# Patient Record
Sex: Female | Born: 1970 | Race: Black or African American | Hispanic: No | Marital: Married | Smoking: Never smoker
Health system: Southern US, Community
[De-identification: ages and names within clinical notes are randomized; demographics above are authoritative.]

## PROBLEM LIST (undated history)

## (undated) DIAGNOSIS — J40 Bronchitis, not specified as acute or chronic: Secondary | ICD-10-CM

## (undated) DIAGNOSIS — I10 Essential (primary) hypertension: Secondary | ICD-10-CM

---

## 1997-12-17 ENCOUNTER — Emergency Department (HOSPITAL_COMMUNITY): Admission: EM | Admit: 1997-12-17 | Discharge: 1997-12-17 | Payer: Self-pay | Admitting: Emergency Medicine

## 1998-05-08 ENCOUNTER — Emergency Department (HOSPITAL_COMMUNITY): Admission: EM | Admit: 1998-05-08 | Discharge: 1998-05-08 | Payer: Self-pay | Admitting: Emergency Medicine

## 1998-06-02 ENCOUNTER — Emergency Department (HOSPITAL_COMMUNITY): Admission: EM | Admit: 1998-06-02 | Discharge: 1998-06-02 | Payer: Self-pay | Admitting: Emergency Medicine

## 1999-06-05 ENCOUNTER — Other Ambulatory Visit: Admission: RE | Admit: 1999-06-05 | Discharge: 1999-06-05 | Payer: Self-pay | Admitting: Obstetrics

## 2000-10-16 ENCOUNTER — Emergency Department (HOSPITAL_COMMUNITY): Admission: EM | Admit: 2000-10-16 | Discharge: 2000-10-16 | Payer: Self-pay | Admitting: Emergency Medicine

## 2000-10-16 ENCOUNTER — Encounter: Payer: Self-pay | Admitting: Emergency Medicine

## 2001-12-19 ENCOUNTER — Emergency Department (HOSPITAL_COMMUNITY): Admission: EM | Admit: 2001-12-19 | Discharge: 2001-12-19 | Payer: Self-pay | Admitting: Emergency Medicine

## 2003-07-12 ENCOUNTER — Emergency Department (HOSPITAL_COMMUNITY): Admission: AD | Admit: 2003-07-12 | Discharge: 2003-07-12 | Payer: Self-pay | Admitting: Family Medicine

## 2006-01-09 ENCOUNTER — Emergency Department (HOSPITAL_COMMUNITY): Admission: EM | Admit: 2006-01-09 | Discharge: 2006-01-09 | Payer: Self-pay | Admitting: Emergency Medicine

## 2010-01-23 ENCOUNTER — Emergency Department (HOSPITAL_COMMUNITY): Admission: EM | Admit: 2010-01-23 | Discharge: 2010-01-23 | Payer: Self-pay | Admitting: Emergency Medicine

## 2010-03-11 ENCOUNTER — Emergency Department (HOSPITAL_COMMUNITY): Admission: EM | Admit: 2010-03-11 | Discharge: 2010-03-11 | Payer: Self-pay | Admitting: Emergency Medicine

## 2010-10-30 ENCOUNTER — Emergency Department (HOSPITAL_COMMUNITY)
Admission: EM | Admit: 2010-10-30 | Discharge: 2010-10-30 | Disposition: A | Discharge status: Home / Self Care | Payer: Medicaid Other | Attending: Emergency Medicine | Admitting: Emergency Medicine

## 2010-10-30 DIAGNOSIS — I10 Essential (primary) hypertension: Secondary | ICD-10-CM | POA: Insufficient documentation

## 2010-10-30 DIAGNOSIS — H9209 Otalgia, unspecified ear: Secondary | ICD-10-CM | POA: Insufficient documentation

## 2010-10-30 DIAGNOSIS — K089 Disorder of teeth and supporting structures, unspecified: Secondary | ICD-10-CM | POA: Insufficient documentation

## 2010-10-30 DIAGNOSIS — K029 Dental caries, unspecified: Secondary | ICD-10-CM | POA: Insufficient documentation

## 2010-10-30 DIAGNOSIS — J45909 Unspecified asthma, uncomplicated: Secondary | ICD-10-CM | POA: Insufficient documentation

## 2010-10-30 DIAGNOSIS — R1013 Epigastric pain: Secondary | ICD-10-CM | POA: Insufficient documentation

## 2010-10-30 LAB — URINALYSIS, ROUTINE W REFLEX MICROSCOPIC
Bilirubin Urine: NEGATIVE
Glucose, UA: NEGATIVE mg/dL
Hgb urine dipstick: NEGATIVE
Ketones, ur: NEGATIVE mg/dL
Nitrite: NEGATIVE
Protein, ur: NEGATIVE mg/dL
Specific Gravity, Urine: 1.022 (ref 1.005–1.030)
Urobilinogen, UA: 0.2 mg/dL (ref 0.0–1.0)
pH: 5.5 (ref 5.0–8.0)

## 2010-10-30 LAB — POCT PREGNANCY, URINE: Preg Test, Ur: NEGATIVE

## 2011-01-20 ENCOUNTER — Emergency Department (HOSPITAL_COMMUNITY)
Admission: EM | Admit: 2011-01-20 | Discharge: 2011-01-21 | Disposition: A | Discharge status: Home / Self Care | Payer: Medicaid Other | Attending: Emergency Medicine | Admitting: Emergency Medicine

## 2011-01-20 DIAGNOSIS — K029 Dental caries, unspecified: Secondary | ICD-10-CM | POA: Insufficient documentation

## 2011-01-20 DIAGNOSIS — J45909 Unspecified asthma, uncomplicated: Secondary | ICD-10-CM | POA: Insufficient documentation

## 2011-01-20 DIAGNOSIS — K047 Periapical abscess without sinus: Secondary | ICD-10-CM | POA: Insufficient documentation

## 2011-01-20 DIAGNOSIS — I10 Essential (primary) hypertension: Secondary | ICD-10-CM | POA: Insufficient documentation

## 2011-01-20 DIAGNOSIS — K089 Disorder of teeth and supporting structures, unspecified: Secondary | ICD-10-CM | POA: Insufficient documentation

## 2011-01-31 ENCOUNTER — Emergency Department (HOSPITAL_COMMUNITY)
Admission: EM | Admit: 2011-01-31 | Discharge: 2011-01-31 | Disposition: A | Discharge status: Home / Self Care | Payer: Medicaid Other | Attending: Emergency Medicine | Admitting: Emergency Medicine

## 2011-01-31 DIAGNOSIS — I10 Essential (primary) hypertension: Secondary | ICD-10-CM | POA: Insufficient documentation

## 2011-01-31 DIAGNOSIS — J45909 Unspecified asthma, uncomplicated: Secondary | ICD-10-CM | POA: Insufficient documentation

## 2011-01-31 DIAGNOSIS — M79609 Pain in unspecified limb: Secondary | ICD-10-CM | POA: Insufficient documentation

## 2011-01-31 DIAGNOSIS — G5600 Carpal tunnel syndrome, unspecified upper limb: Secondary | ICD-10-CM | POA: Insufficient documentation

## 2011-01-31 DIAGNOSIS — Z79899 Other long term (current) drug therapy: Secondary | ICD-10-CM | POA: Insufficient documentation

## 2011-01-31 DIAGNOSIS — M25539 Pain in unspecified wrist: Secondary | ICD-10-CM | POA: Insufficient documentation

## 2011-05-13 ENCOUNTER — Emergency Department (HOSPITAL_COMMUNITY)
Admission: EM | Admit: 2011-05-13 | Discharge: 2011-05-13 | Disposition: A | Discharge status: Home / Self Care | Payer: Medicaid Other | Attending: Emergency Medicine | Admitting: Emergency Medicine

## 2011-05-13 ENCOUNTER — Encounter: Payer: Self-pay | Admitting: Emergency Medicine

## 2011-05-13 DIAGNOSIS — K029 Dental caries, unspecified: Secondary | ICD-10-CM | POA: Insufficient documentation

## 2011-05-13 DIAGNOSIS — K0889 Other specified disorders of teeth and supporting structures: Secondary | ICD-10-CM

## 2011-05-13 DIAGNOSIS — S025XXA Fracture of tooth (traumatic), initial encounter for closed fracture: Secondary | ICD-10-CM | POA: Insufficient documentation

## 2011-05-13 DIAGNOSIS — IMO0002 Reserved for concepts with insufficient information to code with codable children: Secondary | ICD-10-CM | POA: Insufficient documentation

## 2011-05-13 DIAGNOSIS — K089 Disorder of teeth and supporting structures, unspecified: Secondary | ICD-10-CM | POA: Insufficient documentation

## 2011-05-13 MED ORDER — PENICILLIN V POTASSIUM 500 MG PO TABS: 500.0000 mg | ORAL_TABLET | Freq: Four times a day (QID) | ORAL | Status: AC

## 2011-05-13 MED ORDER — TRAMADOL HCL 50 MG PO TABS: 50.0000 mg | ORAL_TABLET | Freq: Four times a day (QID) | ORAL | Status: AC | PRN

## 2011-05-13 NOTE — ED Notes (Signed)
Pt stated, i had 2 teeth to break off 2 days ago and having pain.

## 2011-05-13 NOTE — ED Notes (Signed)
C/o tooth pain X4d, also report ear pain and HA

## 2011-05-13 NOTE — ED Provider Notes (Signed)
History     CSN: [id number] Arrival date & time: 05/13/2011  7:31 AM   First MD Initiated Contact with Patient 05/13/11 0750     Chief Complaint  Patient presents with  . Dental Pain     Patient is a 40 y.o. female presenting with tooth pain. The history is provided by the patient.  Dental PainThe primary symptoms include mouth pain and dental injury. Primary symptoms do not include headaches, fever or sore throat. The symptoms began 3 to 5 days ago. The symptoms are unchanged. The symptoms are new. The symptoms occur constantly.  Additional symptoms include: dental sensitivity to temperature. Additional symptoms do not include: gum swelling, gum tenderness, purulent gums, trismus, jaw pain, facial swelling, trouble swallowing, pain with swallowing, excessive salivation, dry mouth, taste disturbance, smell disturbance and drooling. Medical issues include: periodontal disease. Medical issues do not include: alcohol problem, smoking and chewing tobacco.  She was eating a candy apple 4 days ago when 2 R upper teeth broke off. She does not have a dentist that she sees on a regular basis. She has not noted any facial swelling but states she has pain in her cheek which seems to radiate into her ear.  History reviewed. No pertinent past medical history.  History reviewed. No pertinent past surgical history.  No family history on file.  History  Substance Use Topics  . Smoking status: Never Smoker   . Smokeless tobacco: Not on file  . Alcohol Use: No    OB History    Grav Para Term Preterm Abortions TAB SAB Ect Mult Living                  Review of Systems  Constitutional: Negative.  Negative for fever.  HENT: Positive for dental problem. Negative for sore throat, facial swelling, drooling, mouth sores, trouble swallowing and voice change.   Respiratory: Negative.   Cardiovascular: Negative.   Neurological: Negative.  Negative for headaches.    Allergies  Tylenol  Home  Medications  No current outpatient prescriptions on file.  BP 110/79  Pulse 79  Temp(Src) 97.9 F (36.6 C) (Oral)  Resp 20  SpO2 100%  Physical Exam  Nursing note and vitals reviewed. Constitutional: She is oriented to person, place, and time. She appears well-developed and well-nourished. No distress.  HENT:  Head: Normocephalic and atraumatic. No trismus in the jaw.  Right Ear: External ear normal.  Left Ear: External ear normal.  Nose: Nose normal.  Mouth/Throat: Uvula is midline, oropharynx is clear and moist and mucous membranes are normal. Abnormal dentition. Dental caries present. No dental abscesses. No oropharyngeal exudate.    Eyes: Conjunctivae are normal. Pupils are equal, round, and reactive to light.  Neck: Normal range of motion. Neck supple. No tracheal deviation present. No thyromegaly present.  Pulmonary/Chest: No stridor.  Lymphadenopathy:    She has no cervical adenopathy.  Neurological: She is alert and oriented to person, place, and time. No cranial nerve deficit.  Skin: Skin is warm and dry. No rash noted. She is not diaphoretic. No erythema.  Psychiatric: She has a normal mood and affect. Her behavior is normal.    ED Course  Procedures (including critical care time)  Labs Reviewed - No data to display No results found.   1. Pain, dental       MDM  Patient has two broken teeth which are causing her pain. She was given rxes for pain medication and abx. She is aware that she is  to follow up with a dentist as soon as possible. She was given a list of free/low cost dental resources in the area. Patient verbalized understanding and agreed to plan.        Grant Fontana, [place] 05/13/11 [phone]

## 2011-05-13 NOTE — ED Provider Notes (Signed)
Medical screening examination/treatment/procedure(s) were performed by non-physician practitioner and as supervising physician I was immediately available for consultation/collaboration.  Jaedan Schuman P Md Smola, MD 05/13/11 0810 

## 2011-05-16 NOTE — ED Provider Notes (Signed)
Medical screening examination/treatment/procedure(s) were performed by non-physician practitioner and as supervising physician I was immediately available for consultation/collaboration.  Nicholes Stairs, MD 05/16/11 [phone]

## 2011-06-16 ENCOUNTER — Emergency Department (HOSPITAL_COMMUNITY)
Admission: EM | Admit: 2011-06-16 | Discharge: 2011-06-16 | Disposition: A | Discharge status: Home / Self Care | Payer: Medicaid Other | Attending: Emergency Medicine | Admitting: Emergency Medicine

## 2011-06-16 ENCOUNTER — Emergency Department (HOSPITAL_COMMUNITY): Payer: Medicaid Other

## 2011-06-16 ENCOUNTER — Encounter (HOSPITAL_COMMUNITY): Payer: Self-pay | Admitting: Emergency Medicine

## 2011-06-16 DIAGNOSIS — R51 Headache: Secondary | ICD-10-CM | POA: Insufficient documentation

## 2011-06-16 DIAGNOSIS — R197 Diarrhea, unspecified: Secondary | ICD-10-CM | POA: Insufficient documentation

## 2011-06-16 DIAGNOSIS — J111 Influenza due to unidentified influenza virus with other respiratory manifestations: Secondary | ICD-10-CM

## 2011-06-16 DIAGNOSIS — J45909 Unspecified asthma, uncomplicated: Secondary | ICD-10-CM | POA: Insufficient documentation

## 2011-06-16 DIAGNOSIS — R6889 Other general symptoms and signs: Secondary | ICD-10-CM | POA: Insufficient documentation

## 2011-06-16 DIAGNOSIS — M2550 Pain in unspecified joint: Secondary | ICD-10-CM | POA: Insufficient documentation

## 2011-06-16 DIAGNOSIS — R05 Cough: Secondary | ICD-10-CM | POA: Insufficient documentation

## 2011-06-16 DIAGNOSIS — R059 Cough, unspecified: Secondary | ICD-10-CM | POA: Insufficient documentation

## 2011-06-16 DIAGNOSIS — R509 Fever, unspecified: Secondary | ICD-10-CM | POA: Insufficient documentation

## 2011-06-16 DIAGNOSIS — I10 Essential (primary) hypertension: Secondary | ICD-10-CM | POA: Insufficient documentation

## 2011-06-16 DIAGNOSIS — IMO0001 Reserved for inherently not codable concepts without codable children: Secondary | ICD-10-CM | POA: Insufficient documentation

## 2011-06-16 DIAGNOSIS — R112 Nausea with vomiting, unspecified: Secondary | ICD-10-CM | POA: Insufficient documentation

## 2011-06-16 HISTORY — DX: Bronchitis, not specified as acute or chronic: J40

## 2011-06-16 HISTORY — DX: Essential (primary) hypertension: I10

## 2011-06-16 MED ORDER — PROMETHAZINE HCL 25 MG PO TABS: 25.0000 mg | ORAL_TABLET | Freq: Four times a day (QID) | ORAL | Status: AC | PRN

## 2011-06-16 MED ORDER — SODIUM CHLORIDE 0.9 % IV BOLUS (SEPSIS)
1000.0000 mL | Freq: Once | INTRAVENOUS | Status: AC
  Administered 2011-06-16: 1000 mL via INTRAVENOUS

## 2011-06-16 MED ORDER — HYDROCOD POLST-CHLORPHEN POLST 10-8 MG/5ML PO LQCR: 5.0000 mL | Freq: Two times a day (BID) | ORAL | Status: DC

## 2011-06-16 MED ORDER — ONDANSETRON HCL 4 MG/2ML IJ SOLN
4.0000 mg | Freq: Once | INTRAMUSCULAR | Status: AC
  Administered 2011-06-16: 4 mg via INTRAVENOUS
  Filled 2011-06-16: qty 2

## 2011-06-16 NOTE — ED Notes (Signed)
Pt report n/v/d onset yesterday. Pt report cold symptoms x 1 week.

## 2011-06-16 NOTE — ED Provider Notes (Signed)
History     CSN: [id number] Arrival date & time: 06/16/2011  6:32 PM   First MD Initiated Contact with Patient 06/16/11 2035      Chief Complaint  Patient presents with  . Nausea  . Emesis  . Diarrhea    (Consider location/radiation/quality/duration/timing/severity/associated sxs/prior treatment) HPI Comments: Patient here with a week of ILI like symptoms, states she has tried OTC medications without relief - states that her daughter recently diagnosed with PNA and she wants to make sure she does not have this - reports productive cough with yellow sputum production.  Patient is a 40 y.o. female presenting with vomiting and diarrhea. The history is provided by the patient. No language interpreter was used.  Emesis  This is a new problem. The current episode started more than 1 week ago. The problem occurs 2 to 4 times per day. The problem has not changed since onset.The emesis has an appearance of stomach contents. The maximum temperature recorded prior to her arrival was 100 to 100.9 F. The fever has been present for 1 to 2 days. Associated symptoms include arthralgias, chills, cough, diarrhea, a fever, headaches, myalgias and URI. Pertinent negatives include no abdominal pain and no sweats. Risk factors include ill contacts.  Diarrhea The primary symptoms include fever, vomiting, diarrhea, myalgias and arthralgias. Primary symptoms do not include abdominal pain. The illness began 3 to 5 days ago. The onset was gradual.  The fever began more than 1 week ago. The fever has been unchanged since its onset. The maximum temperature recorded prior to her arrival was unknown.  The illness is also significant for chills. The illness does not include bloating, constipation, tenesmus or back pain.    Past Medical History  Diagnosis Date  . Hypertension   . Asthma   . Bronchitis     History reviewed. No pertinent past surgical history.  History reviewed. No pertinent family  history.  History  Substance Use Topics  . Smoking status: Never Smoker   . Smokeless tobacco: Not on file  . Alcohol Use: No    OB History    Grav Para Term Preterm Abortions TAB SAB Ect Mult Living                  Review of Systems  Constitutional: Positive for fever and chills.  Respiratory: Positive for cough.   Gastrointestinal: Positive for vomiting and diarrhea. Negative for abdominal pain, constipation and bloating.  Musculoskeletal: Positive for myalgias and arthralgias. Negative for back pain.  Neurological: Positive for headaches.  All other systems reviewed and are negative.    Allergies  Tylenol  Home Medications   Current Outpatient Rx  Name Route Sig Dispense Refill  . IBUPROFEN PO Oral Take 1 tablet by mouth every 6 (six) hours as needed. For fever.       BP 121/89  Pulse 94  Temp(Src) 98.2 F (36.8 C) (Oral)  Resp 18  SpO2 100%  LMP 06/11/2011  Physical Exam  Nursing note and vitals reviewed. Constitutional: She is oriented to person, place, and time. She appears well-developed and well-nourished. No distress.  HENT:  Head: Normocephalic and atraumatic.  Right Ear: External ear normal.  Left Ear: External ear normal.  Nose: Nose normal.  Mouth/Throat: Oropharynx is clear and moist. No oropharyngeal exudate.  Eyes: Conjunctivae are normal. Pupils are equal, round, and reactive to light. No scleral icterus.  Neck: Normal range of motion. Neck supple.  Cardiovascular: Normal rate, regular rhythm and normal heart sounds.  Pulmonary/Chest: Effort normal and breath sounds normal. No respiratory distress. She has no wheezes. She exhibits no tenderness.  Abdominal: Soft. Bowel sounds are normal. There is no tenderness.  Musculoskeletal: Normal range of motion.  Lymphadenopathy:    She has no cervical adenopathy.  Neurological: She is alert and oriented to person, place, and time. No cranial nerve deficit.  Skin: Skin is warm and dry.   Psychiatric: She has a normal mood and affect. Her behavior is normal. Judgment and thought content normal.    ED Course  Procedures (including critical care time)  Labs Reviewed - No data to display Dg Chest 2 View  06/16/2011  *RADIOLOGY REPORT*  Clinical Data: Cough.  Chest congestion.  Nausea and vomiting. Smoker with history of hypertension.  CHEST - 2 VIEW 06/16/2011:  Comparison: One-view chest x-ray 07/12/2003 [facility].  Findings: Cardiomediastinal silhouette unremarkable.  Lungs clear. Bronchovascular markings normal.  Pulmonary vascularity normal.  No pleural effusions.  No pneumothorax.  Visualized bony thorax intact.  No significant interval change.  IMPRESSION: Normal examination.  Original Report Authenticated By: Arnell Sieving, M.D.     Influenza like illness    MDM  Patient with ILI x 1 week - moderately dry based on mucous membranes, will give a liter of fluids, get chest x-ray to make sure no bacterial component and then treat symtpoms.    Feels better after a liter of fluids, able to keep down po's, will discharge home    Scarlette Calico C. [place], [place] 06/16/11 2253

## 2011-06-17 NOTE — ED Provider Notes (Signed)
Medical screening examination/treatment/procedure(s) were performed by non-physician practitioner and as supervising physician I was immediately available for consultation/collaboration.   Lovie Zarling M Trevonn Hallum, MD 06/17/11 0026 

## 2011-10-30 ENCOUNTER — Ambulatory Visit (HOSPITAL_COMMUNITY)
Admission: RE | Admit: 2011-10-30 | Discharge: 2011-10-30 | Disposition: A | Discharge status: Home / Self Care | Payer: Medicaid Other | Source: Ambulatory Visit | Attending: Internal Medicine | Admitting: Internal Medicine

## 2011-10-30 ENCOUNTER — Other Ambulatory Visit (HOSPITAL_COMMUNITY): Payer: Self-pay | Admitting: Internal Medicine

## 2011-10-30 DIAGNOSIS — M545 Low back pain: Secondary | ICD-10-CM

## 2011-10-30 DIAGNOSIS — M62838 Other muscle spasm: Secondary | ICD-10-CM

## 2011-10-30 DIAGNOSIS — M542 Cervicalgia: Secondary | ICD-10-CM | POA: Insufficient documentation

## 2015-07-18 ENCOUNTER — Encounter (HOSPITAL_COMMUNITY): Payer: Self-pay | Admitting: Emergency Medicine

## 2015-07-18 ENCOUNTER — Emergency Department (HOSPITAL_COMMUNITY)
Admission: EM | Admit: 2015-07-18 | Discharge: 2015-07-19 | Disposition: A | Discharge status: Home / Self Care | Payer: Medicaid Other | Attending: Emergency Medicine | Admitting: Emergency Medicine

## 2015-07-18 ENCOUNTER — Emergency Department (HOSPITAL_COMMUNITY): Payer: Medicaid Other

## 2015-07-18 DIAGNOSIS — Z79899 Other long term (current) drug therapy: Secondary | ICD-10-CM | POA: Insufficient documentation

## 2015-07-18 DIAGNOSIS — I10 Essential (primary) hypertension: Secondary | ICD-10-CM | POA: Insufficient documentation

## 2015-07-18 DIAGNOSIS — J45901 Unspecified asthma with (acute) exacerbation: Secondary | ICD-10-CM

## 2015-07-18 DIAGNOSIS — J189 Pneumonia, unspecified organism: Secondary | ICD-10-CM

## 2015-07-18 DIAGNOSIS — R630 Anorexia: Secondary | ICD-10-CM | POA: Insufficient documentation

## 2015-07-18 DIAGNOSIS — J159 Unspecified bacterial pneumonia: Secondary | ICD-10-CM | POA: Insufficient documentation

## 2015-07-18 MED ORDER — IPRATROPIUM BROMIDE 0.02 % IN SOLN
1.0000 mg | Freq: Once | RESPIRATORY_TRACT | Status: AC
  Administered 2015-07-19: 1 mg via RESPIRATORY_TRACT
  Filled 2015-07-18: qty 5

## 2015-07-18 MED ORDER — HYDROCOD POLST-CPM POLST ER 10-8 MG/5ML PO SUER
5.0000 mL | Freq: Once | ORAL | Status: AC
  Administered 2015-07-18: 5 mL via ORAL
  Filled 2015-07-18: qty 5

## 2015-07-18 MED ORDER — PREDNISONE 20 MG PO TABS
60.0000 mg | ORAL_TABLET | Freq: Once | ORAL | Status: AC
  Administered 2015-07-18: 60 mg via ORAL
  Filled 2015-07-18: qty 3

## 2015-07-18 MED ORDER — ALBUTEROL (5 MG/ML) CONTINUOUS INHALATION SOLN
10.0000 mg/h | INHALATION_SOLUTION | RESPIRATORY_TRACT | Status: DC
  Administered 2015-07-19: 10 mg/h via RESPIRATORY_TRACT
  Filled 2015-07-18: qty 20

## 2015-07-18 MED ORDER — IBUPROFEN 800 MG PO TABS
800.0000 mg | ORAL_TABLET | Freq: Once | ORAL | Status: AC
  Administered 2015-07-18: 800 mg via ORAL
  Filled 2015-07-18: qty 1

## 2015-07-18 NOTE — ED Notes (Signed)
Pt transported to radiology.

## 2015-07-18 NOTE — ED Provider Notes (Signed)
CSN: [id number]     Arrival date & time 07/18/15  2252 History   By signing my name below, I, Gonzella LexKimberly Bianca Gray, attest that this documentation has been prepared under the direction and in the presence of Tomasita CrumbleAdeleke Mirabella Hilario, MD. Electronically Signed: Gonzella LexKimberly Bianca Gray, Scribe. 07/18/2015. 11:52 PM.    Chief Complaint  Patient presents with  . Asthma  . Generalized Body Aches    The history is provided by the patient. No language interpreter was used.    HPI Comments: Sheila Pena is a 45 y.o. female who presents to the Emergency Department complaining of worsening asthma and bronchitis symptoms, generalized body aches, SOB, difficulty sleeping, loss of appetite, rhinorrhea, congestion and productive cough with green sputum for the past week. She states that she has been around multiple sick contacts. Pt has been using her albuterol inhaler, cold medication, NyQuil, and spiriva with no relief of symptoms. She has not had a flu shot this year. Pt's temperature was measured to be 100 in the ED.   Past Medical History  Diagnosis Date  . Hypertension   . Asthma   . Bronchitis    History reviewed. No pertinent past surgical history. No family history on file. Social History  Substance Use Topics  . Smoking status: Never Smoker   . Smokeless tobacco: None  . Alcohol Use: No   OB History    No data available     Review of Systems  Constitutional: Positive for fever and appetite change.  HENT: Positive for congestion and rhinorrhea.   Respiratory: Positive for cough and shortness of breath.   All other systems reviewed and are negative.  Allergies  Tylenol  Home Medications   Prior to Admission medications   Medication Sig Start Date End Date Taking? Authorizing Provider  chlorpheniramine-HYDROcodone (TUSSIONEX PENNKINETIC ER) 10-8 MG/5ML LQCR Take 5 mLs by mouth every 12 (twelve) hours. 06/16/11   Cherrie DistanceFrances Sanford, PA-C  IBUPROFEN PO Take 1 tablet by mouth every 6 (six) hours as  needed. For fever.     Historical Provider, MD   BP 173/109 mmHg  Pulse 115  Temp(Src) 98.3 F (36.8 C) (Oral)  Resp 16  Ht 5\' 3"  (1.6 m)  Wt 155 lb (70.308 kg)  BMI 27.46 kg/m2  SpO2 96%  LMP 06/11/2011 Physical Exam  Constitutional: She is oriented to person, place, and time. She appears well-developed and well-nourished. No distress.  HENT:  Head: Normocephalic and atraumatic.  Nose: Nose normal.  Mouth/Throat: Oropharynx is clear and moist. No oropharyngeal exudate.  tactile fever   Eyes: Conjunctivae and EOM are normal. Pupils are equal, round, and reactive to light. No scleral icterus.  Neck: Normal range of motion. Neck supple. No JVD present. No tracheal deviation present. No thyromegaly present.  Cardiovascular: Normal rate, regular rhythm and normal heart sounds.  Exam reveals no gallop and no friction rub.   No murmur heard. Pulmonary/Chest: No respiratory distress. She has wheezes. She exhibits no tenderness.  Isolated right upper and lower lobe wheezing intermittently  Abdominal: Soft. Bowel sounds are normal. She exhibits no distension and no mass. There is no tenderness. There is no rebound and no guarding.  Musculoskeletal: Normal range of motion. She exhibits no edema or tenderness.  Lymphadenopathy:    She has no cervical adenopathy.  Neurological: She is alert and oriented to person, place, and time. No cranial nerve deficit. She exhibits normal muscle tone.  Skin: Skin is warm and dry. No rash noted. No erythema. No  pallor.  Nursing note and vitals reviewed.   ED Course  Procedures  DIAGNOSTIC STUDIES:    Oxygen Saturation is 98% on RA, normal by my interpretation.   COORDINATION OF CARE:  11:32 PM Will order chest x ray. Will administer pt medication in the ED. Discussed treatment plan with pt at bedside and pt agreed to plan.   Imaging Review Dg Chest 2 View  07/19/2015  CLINICAL DATA:  Fever, cough, shortness of breath for 1 week. Evaluate for  pneumonia. EXAM: CHEST  2 VIEW COMPARISON:  06/16/2011 FINDINGS: The cardiomediastinal contours are normal. Diffuse bronchial thickening. Pulmonary vasculature is normal. No consolidation, pleural effusion, or pneumothorax. No acute osseous abnormalities are seen. IMPRESSION: Diffuse bronchial thickening consistent with bronchitis. No confluent airspace disease. Electronically Signed   By: Rubye Oaks M.D.   On: 07/19/2015 00:35   I have personally reviewed and evaluated these images as part of my medical decision-making.   MDM   Final diagnoses:  None    Patient presents to the ED for evaluation of her respiratory symptoms. She states she has been wheezing as well.  Along with her diffuse body aches, this may be influenza.  She has asymmetric lung findings however so CXR was obtained. She was given albuterol, ipratropium, prednisone, and tussionex for her symptoms.  Motrin given for fever.  Upon repeat evaluation, patient states she feels much better. We'll discharge home with albuterol inhaler, prednisone and azithromycin for 5 days. Chest x-ray is negative for pneumonia however history is consistent with this diagnosis. HCTZ was also refilled as the patient has run out. Primary care follow-up was advised within 3 days for close management of her blood pressure. She appears well and in no acute distress, vital signs were within her normal limits and she is safe for discharge.  I personally performed the services described in this documentation, which was scribed in my presence. The recorded information has been reviewed and is accurate.       Tomasita Crumble, MD 07/19/15 [phone]

## 2015-07-18 NOTE — ED Notes (Signed)
Pt from home for eval of asthma, pt states she also has bronchitis and breathing treatments at home have not been working. Pt able to speak in complete sentences however has labored breathing.

## 2015-07-19 MED ORDER — PREDNISONE 20 MG PO TABS: 60.0000 mg | ORAL_TABLET | Freq: Every day | ORAL | Status: DC

## 2015-07-19 MED ORDER — HYDROCHLOROTHIAZIDE 25 MG PO TABS: 25.0000 mg | ORAL_TABLET | Freq: Every day | ORAL | Status: DC

## 2015-07-19 MED ORDER — AZITHROMYCIN 250 MG PO TABS: 250.0000 mg | ORAL_TABLET | Freq: Every day | ORAL | Status: DC

## 2015-07-19 MED ORDER — IPRATROPIUM-ALBUTEROL 0.5-2.5 (3) MG/3ML IN SOLN: 3.0000 mL | RESPIRATORY_TRACT | Status: DC

## 2015-07-19 MED ORDER — ALBUTEROL SULFATE HFA 108 (90 BASE) MCG/ACT IN AERS
2.0000 | INHALATION_SPRAY | Freq: Once | RESPIRATORY_TRACT | Status: AC
  Administered 2015-07-19: 2 via RESPIRATORY_TRACT
  Filled 2015-07-19: qty 6.7

## 2015-07-19 MED ORDER — AZITHROMYCIN 250 MG PO TABS
500.0000 mg | ORAL_TABLET | Freq: Once | ORAL | Status: AC
  Administered 2015-07-19: 500 mg via ORAL
  Filled 2015-07-19: qty 2

## 2015-07-19 NOTE — Discharge Instructions (Signed)
Community-Acquired Pneumonia, Adult Sheila Pena, take antibiotics and steroids to complete your 5 day treatment. Take your high blood pressure medicine every day and see your primary care doctor within 3 days for close follow-up. If any symptoms worsen come back to the emergency department immediately. Thank you. Pneumonia is an infection of the lungs. One type of pneumonia can happen while a person is in a hospital. A different type can happen when a person is not in a hospital (community-acquired pneumonia). It is easy for this kind to spread from person to person. It can spread to you if you breathe near an infected person who coughs or sneezes. Some symptoms include:  A dry cough.  A wet (productive) cough.  Fever.  Sweating.  Chest pain. HOME CARE  Take over-the-counter and prescription medicines only as told by your doctor.  Only take cough medicine if you are losing sleep.  If you were prescribed an antibiotic medicine, take it as told by your doctor. Do not stop taking the antibiotic even if you start to feel better.  Sleep with your head and neck raised (elevated). You can do this by putting a few pillows under your head, or you can sleep in a recliner.  Do not use tobacco products. These include cigarettes, chewing tobacco, and e-cigarettes. If you need help quitting, ask your doctor.  Drink enough water to keep your pee (urine) clear or pale yellow. A shot (vaccine) can help prevent pneumonia. Shots are often suggested for:  People older than 44 years of age.  People older than 44 years of age:  Who are having cancer treatment.  Who have long-term (chronic) lung disease.  Who have problems with their body's defense system (immune system). You may also prevent pneumonia if you take these actions:  Get the flu (influenza) shot every year.  Go to the dentist as often as told.  Wash your hands often. If soap and water are not available, use hand sanitizer. GET HELP  IF:  You have a fever.  You lose sleep because your cough medicine does not help. GET HELP RIGHT AWAY IF:  You are short of breath and it gets worse.  You have more chest pain.  Your sickness gets worse. This is very serious if:  You are an older adult.  Your body's defense system is weak.  You cough up blood.   This information is not intended to replace advice given to you by your health care provider. Make sure you discuss any questions you have with your health care provider.   Document Released: 12/12/2007 Document Revised: 03/16/2015 Document Reviewed: 10/20/2014 Elsevier Interactive Patient Education [employer]2016 [employer]

## 2017-08-06 ENCOUNTER — Encounter (HOSPITAL_COMMUNITY): Payer: Self-pay | Admitting: *Deleted

## 2017-08-06 ENCOUNTER — Other Ambulatory Visit: Payer: Self-pay

## 2017-08-06 ENCOUNTER — Emergency Department (HOSPITAL_COMMUNITY)
Admission: EM | Admit: 2017-08-06 | Discharge: 2017-08-06 | Disposition: A | Discharge status: Home / Self Care | Payer: No Typology Code available for payment source | Attending: Emergency Medicine | Admitting: Emergency Medicine

## 2017-08-06 ENCOUNTER — Emergency Department (HOSPITAL_COMMUNITY): Payer: No Typology Code available for payment source

## 2017-08-06 DIAGNOSIS — S39012A Strain of muscle, fascia and tendon of lower back, initial encounter: Secondary | ICD-10-CM | POA: Diagnosis not present

## 2017-08-06 DIAGNOSIS — Y939 Activity, unspecified: Secondary | ICD-10-CM | POA: Insufficient documentation

## 2017-08-06 DIAGNOSIS — I10 Essential (primary) hypertension: Secondary | ICD-10-CM | POA: Insufficient documentation

## 2017-08-06 DIAGNOSIS — Y929 Unspecified place or not applicable: Secondary | ICD-10-CM | POA: Insufficient documentation

## 2017-08-06 DIAGNOSIS — Y999 Unspecified external cause status: Secondary | ICD-10-CM | POA: Insufficient documentation

## 2017-08-06 DIAGNOSIS — Z79899 Other long term (current) drug therapy: Secondary | ICD-10-CM | POA: Insufficient documentation

## 2017-08-06 DIAGNOSIS — J45909 Unspecified asthma, uncomplicated: Secondary | ICD-10-CM | POA: Insufficient documentation

## 2017-08-06 DIAGNOSIS — S3992XA Unspecified injury of lower back, initial encounter: Secondary | ICD-10-CM | POA: Diagnosis present

## 2017-08-06 MED ORDER — NAPROXEN 500 MG PO TABS: 500.0000 mg | ORAL_TABLET | Freq: Two times a day (BID) | ORAL | 0 refills | Status: DC

## 2017-08-06 MED ORDER — CYCLOBENZAPRINE HCL 10 MG PO TABS: 10.0000 mg | ORAL_TABLET | Freq: Two times a day (BID) | ORAL | 0 refills | Status: DC | PRN

## 2017-08-06 NOTE — ED Notes (Signed)
Patient verbalized understanding of discharge instructions and denies any further needs or questions at this time. VS stable. Patient ambulatory with steady gait.  

## 2017-08-06 NOTE — ED Notes (Signed)
Patient transported to x-ray. ?
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
## 2017-08-06 NOTE — Discharge Instructions (Signed)
Please read attached information regarding your condition. Take naproxen, Flexeril to help with symptoms. Apply heating pad and stretch areas as tolerated. Follow up with your PCP for further evaluation if symptoms persist. Return to ED for worsening pain, trouble walking, loss of bowel or bladder function, numbness in legs, injuries or falls.
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
## 2017-09-10 ENCOUNTER — Encounter (HOSPITAL_COMMUNITY): Payer: Self-pay | Admitting: Emergency Medicine

## 2017-09-10 ENCOUNTER — Other Ambulatory Visit: Payer: Self-pay

## 2017-09-10 ENCOUNTER — Emergency Department (HOSPITAL_COMMUNITY)
Admission: EM | Admit: 2017-09-10 | Discharge: 2017-09-10 | Disposition: A | Discharge status: Home / Self Care | Payer: No Typology Code available for payment source | Attending: Emergency Medicine | Admitting: Emergency Medicine

## 2017-09-10 ENCOUNTER — Emergency Department (HOSPITAL_COMMUNITY): Payer: No Typology Code available for payment source

## 2017-09-10 DIAGNOSIS — M549 Dorsalgia, unspecified: Secondary | ICD-10-CM

## 2017-09-10 DIAGNOSIS — M62830 Muscle spasm of back: Secondary | ICD-10-CM | POA: Diagnosis not present

## 2017-09-10 DIAGNOSIS — J45909 Unspecified asthma, uncomplicated: Secondary | ICD-10-CM | POA: Insufficient documentation

## 2017-09-10 DIAGNOSIS — M545 Low back pain: Secondary | ICD-10-CM | POA: Diagnosis not present

## 2017-09-10 DIAGNOSIS — G8929 Other chronic pain: Secondary | ICD-10-CM

## 2017-09-10 DIAGNOSIS — Z79899 Other long term (current) drug therapy: Secondary | ICD-10-CM | POA: Insufficient documentation

## 2017-09-10 DIAGNOSIS — I10 Essential (primary) hypertension: Secondary | ICD-10-CM | POA: Diagnosis not present

## 2017-09-10 LAB — POC URINE PREG, ED: Preg Test, Ur: NEGATIVE

## 2017-09-10 MED ORDER — TRAMADOL HCL 50 MG PO TABS: 50.0000 mg | ORAL_TABLET | Freq: Four times a day (QID) | ORAL | 0 refills | Status: DC | PRN

## 2017-09-10 MED ORDER — TRAMADOL HCL 50 MG PO TABS
50.0000 mg | ORAL_TABLET | Freq: Once | ORAL | Status: AC
  Administered 2017-09-10: 50 mg via ORAL
  Filled 2017-09-10: qty 1

## 2017-09-10 MED ORDER — METHOCARBAMOL 500 MG PO TABS: 500.0000 mg | ORAL_TABLET | Freq: Two times a day (BID) | ORAL | 0 refills | Status: DC

## 2017-09-10 NOTE — ED Notes (Signed)
No e-signature available at this time

## 2017-09-10 NOTE — Discharge Instructions (Addendum)
Your x-rays today were negative for any acute abnormalities.  I suspect her symptoms are likely a result of muscle strain.  You can take the Tramadol for severe or breakthrough pain.  Take Robaxin as prescribed. This medication will make you drowsy so do not drive or drink alcohol when taking it.  As we discussed, you may need to have follow-up with an orthopedic doctor.  Follow-up with your primary care doctor in the next 2-4 days for further evaluation.  Return to the Emergency Department immediately for any worsening back pain, neck pain, difficulty walking, numbness/weaknss of your arms or legs, urinary or bowel accidents, fever or any other worsening or concerning symptoms.
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
## 2017-09-10 NOTE — ED Notes (Signed)
Got patient on the monitor patient is resting with call bell in reach  ?

## 2017-09-10 NOTE — ED Notes (Signed)
D/c reviewed with patient. No further questions at this time 
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
## 2017-09-10 NOTE — ED Notes (Signed)
Patient transported to X-ray 

## 2017-09-10 NOTE — ED Notes (Signed)
ED Provider at bedside. 
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
## 2019-11-12 ENCOUNTER — Ambulatory Visit: Payer: Self-pay | Attending: Internal Medicine

## 2019-11-12 DIAGNOSIS — Z23 Encounter for immunization: Secondary | ICD-10-CM

## 2019-11-12 NOTE — Progress Notes (Signed)
   Covid-19 Vaccination Clinic  Name:  Sheila Pena    MRN: [id number] DOB: June 20, 1971  11/12/2019  [name] was observed post Covid-19 immunization for 30 minutes based on pre-vaccination screening without incident. She was provided with Vaccine Information Sheet and instruction to access the V-Safe system.   [name] was instructed to call 911 with any severe reactions post vaccine: Marland Kitchen Difficulty breathing  . Swelling of face and throat  . A fast heartbeat  . A bad rash all over body  . Dizziness and weakness   Immunizations Administered    Name Date Dose VIS Date Route   Pfizer COVID-19 Vaccine 11/12/2019  8:21 AM 0.3 mL 09/02/2018 Intramuscular   Manufacturer: [employer], [employer]   Lot: [id number]   NDC: [address]-3

## 2019-12-08 ENCOUNTER — Ambulatory Visit: Payer: Self-pay | Attending: Internal Medicine

## 2019-12-08 DIAGNOSIS — Z23 Encounter for immunization: Secondary | ICD-10-CM

## 2019-12-08 NOTE — Progress Notes (Signed)
   Covid-19 Vaccination Clinic  Name:  Kaylena Pacifico    MRN: [id number] DOB: July 18, 1970  12/08/2019  Ms. Rosell was observed post Covid-19 immunization for 15 minutes without incident. She was provided with Vaccine Information Sheet and instruction to access the V-Safe system.   Ms. Moseley was instructed to call 911 with any severe reactions post vaccine: Marland Kitchen Difficulty breathing  . Swelling of face and throat  . A fast heartbeat  . A bad rash all over body  . Dizziness and weakness   Immunizations Administered    Name Date Dose VIS Date Route   Pfizer COVID-19 Vaccine 12/08/2019  8:19 AM 0.3 mL 09/02/2018 Intramuscular   Manufacturer: [employer], [employer]   Lot: [id number]   NDC: [address]-2

## 2023-12-24 ENCOUNTER — Ambulatory Visit: Admitting: Family

## 2023-12-24 ENCOUNTER — Encounter: Payer: Self-pay | Admitting: Family

## 2023-12-24 VITALS — BP 127/89 | HR 77 | Temp 98.1°F | Resp 16 | Ht 63.0 in | Wt 170.8 lb

## 2023-12-24 DIAGNOSIS — Z7689 Persons encountering health services in other specified circumstances: Secondary | ICD-10-CM

## 2023-12-24 DIAGNOSIS — K5900 Constipation, unspecified: Secondary | ICD-10-CM | POA: Diagnosis not present

## 2023-12-24 DIAGNOSIS — E1165 Type 2 diabetes mellitus with hyperglycemia: Secondary | ICD-10-CM

## 2023-12-24 DIAGNOSIS — F418 Other specified anxiety disorders: Secondary | ICD-10-CM | POA: Diagnosis not present

## 2023-12-24 DIAGNOSIS — J3089 Other allergic rhinitis: Secondary | ICD-10-CM | POA: Diagnosis not present

## 2023-12-24 DIAGNOSIS — F32A Depression, unspecified: Secondary | ICD-10-CM

## 2023-12-24 DIAGNOSIS — I10 Essential (primary) hypertension: Secondary | ICD-10-CM

## 2023-12-24 DIAGNOSIS — Z1211 Encounter for screening for malignant neoplasm of colon: Secondary | ICD-10-CM

## 2023-12-24 DIAGNOSIS — G894 Chronic pain syndrome: Secondary | ICD-10-CM

## 2023-12-24 DIAGNOSIS — Z7984 Long term (current) use of oral hypoglycemic drugs: Secondary | ICD-10-CM | POA: Diagnosis not present

## 2023-12-24 MED ORDER — POLYETHYLENE GLYCOL 3350 17 G PO PACK: 17.0000 g | PACK | Freq: Every day | ORAL | 0 refills | Status: AC

## 2023-12-24 MED ORDER — METFORMIN HCL 500 MG PO TABS: 500.0000 mg | ORAL_TABLET | Freq: Every day | ORAL | 0 refills | Status: DC

## 2023-12-24 MED ORDER — HYDROXYZINE PAMOATE 25 MG PO CAPS: 25.0000 mg | ORAL_CAPSULE | Freq: Three times a day (TID) | ORAL | 2 refills | Status: DC | PRN

## 2023-12-24 MED ORDER — ESCITALOPRAM OXALATE 5 MG PO TABS: 5.0000 mg | ORAL_TABLET | Freq: Every day | ORAL | 0 refills | Status: DC

## 2023-12-24 MED ORDER — CYCLOBENZAPRINE HCL 10 MG PO TABS: 10.0000 mg | ORAL_TABLET | Freq: Two times a day (BID) | ORAL | 1 refills | Status: AC | PRN

## 2023-12-24 MED ORDER — IBUPROFEN 800 MG PO TABS
800.0000 mg | ORAL_TABLET | Freq: Three times a day (TID) | ORAL | 2 refills | Status: DC | PRN
Start: 2023-12-24 — End: 2024-03-13

## 2023-12-24 MED ORDER — LISINOPRIL 5 MG PO TABS: 5.0000 mg | ORAL_TABLET | Freq: Every day | ORAL | 0 refills | Status: DC

## 2023-12-24 MED ORDER — HYDROCHLOROTHIAZIDE 25 MG PO TABS: 25.0000 mg | ORAL_TABLET | Freq: Every day | ORAL | 0 refills | Status: DC

## 2023-12-24 MED ORDER — MONTELUKAST SODIUM 10 MG PO TABS: 10.0000 mg | ORAL_TABLET | Freq: Every day | ORAL | 0 refills | Status: AC

## 2023-12-24 MED ORDER — GABAPENTIN 300 MG PO CAPS: 300.0000 mg | ORAL_CAPSULE | Freq: Every day | ORAL | 0 refills | Status: DC

## 2023-12-24 NOTE — Progress Notes (Signed)
 Abdominal pain,  body is in pain and needs pain medication

## 2023-12-24 NOTE — Progress Notes (Signed)
 Subjective:    Sheila Pena - 53 y.o. female MRN [id number]  Date of birth: [birth year]  HPI  Sheila Pena is to establish care.   Current issues and/or concerns: - High blood pressure. Doing well on Lisinopril and Hydrochlorothiazide , no issues/concerns. She does not complain of red flag symptoms such as but not limited to chest pain, shortness of breath, worst headache of life, nausea/vomiting.  - Type 2 diabetes. Doing well on Metformin, no issues/concerns. She denies red flag symptoms associated with diabetes.  - Anxiety depression related to her job and finances. She denies thoughts of self-harm, suicidal ideations, homicidal ideations. - Chronic pain. Reports chronic pain related to left wrist tendinitis, right wrist carpal tunnel, lower back, and bilateral feet. Denies recent trauma/injury and red flag symptoms. Doing well on Cyclobenzaprine , Gabapentin, and Ibuprofen . States Oxycodone is the only medication that really helps. States she was established with Pain Management but the doctor left the office and she needs a new referral. - Allergies. Doing well on Montelukast, no issues/concerns.  - Abdominal pain and constipation. Denies red flag symptoms. Doing well on Miralax, no issues/concerns. - No further issues/concerns for discussion today.   ROS per HPI     Health Maintenance:  Health Maintenance Due  Topic Date Due   HIV Screening  Never done   Diabetic kidney evaluation - eGFR measurement  Never done   Diabetic kidney evaluation - Urine ACR  Never done   Hepatitis C Screening  Never done   Cervical Cancer Screening (HPV/Pap Cotest)  Never done   Colonoscopy  Never done   MAMMOGRAM  Never done     Past Medical History: There are no active problems to display for this patient.     Social History   reports that she has never smoked. She does not have any smokeless tobacco history on file. She reports that she does not drink alcohol and does not use drugs.    Family History  family history is not on file.   Medications: reviewed and updated   Objective:   Physical Exam BP 127/89   Pulse 77   Temp 98.1 F (36.7 C) (Oral)   Resp 16   Ht 5' 3 (1.6 m)   Wt 170 lb 12.8 oz (77.5 kg)   SpO2 97%   BMI 30.26 kg/m   Physical Exam HENT:     Head: Normocephalic and atraumatic.     Nose: Nose normal.     Mouth/Throat:     Mouth: Mucous membranes are moist.     Pharynx: Oropharynx is clear.   Eyes:     Extraocular Movements: Extraocular movements intact.     Conjunctiva/sclera: Conjunctivae normal.     Pupils: Pupils are equal, round, and reactive to light.    Cardiovascular:     Rate and Rhythm: Normal rate and regular rhythm.     Pulses: Normal pulses.     Heart sounds: Normal heart sounds.  Pulmonary:     Effort: Pulmonary effort is normal.     Breath sounds: Normal breath sounds.   Musculoskeletal:        General: Normal range of motion.     Cervical back: Normal range of motion and neck supple.   Neurological:     General: No focal deficit present.     Mental Status: She is alert and oriented to person, place, and time.   Psychiatric:        Mood and Affect: Mood normal.  Behavior: Behavior normal.       Assessment & Plan:  1. Encounter to establish care (Primary) - Patient presents today to establish care. During the interim follow-up with primary provider as scheduled.  - Return for annual physical examination, labs, and health maintenance. Arrive fasting meaning having no food for at least 8 hours prior to appointment. You may have only water or black coffee. Please take scheduled medications as normal.  2. Primary hypertension - Continue Lisinopril and Hydrochlorothiazide  as prescribed.  - Routine screening.  - Counseled on blood pressure goal of less than 130/80, low-sodium, DASH diet, medication compliance, and 150 minutes of moderate intensity exercise per week as tolerated. Counseled on medication  adherence and adverse effects. - Follow-up with primary provider in 3 months or sooner if needed. - hydrochlorothiazide  (HYDRODIURIL ) 25 MG tablet; Take 1 tablet (25 mg total) by mouth daily.  Dispense: 90 tablet; Refill: 0 - lisinopril (ZESTRIL) 5 MG tablet; Take 1 tablet (5 mg total) by mouth daily.  Dispense: 90 tablet; Refill: 0 - Basic Metabolic Panel  3. Type 2 diabetes mellitus with hyperglycemia, without long-term current use of insulin (HCC) - Continue Metformin as prescribed.  - Hemoglobin A1c result pending.  - Discussed the importance of healthy eating habits, low-carbohydrate diet, low-sugar diet, regular aerobic exercise (at least 150 minutes a week as tolerated) and medication compliance to achieve or maintain control of diabetes. Counseled on medication adherence/adverse effects. - Follow-up with primary provider as scheduled. - metFORMIN (GLUCOPHAGE) 500 MG tablet; Take 1 tablet (500 mg total) by mouth daily.  Dispense: 90 tablet; Refill: 0 - Hemoglobin A1c  4. Anxiety and depression - Patient denies thoughts of self-harm, suicidal ideations, homicidal ideations. - Esctialopram and Hydroxyzine as prescribed. Counseled on medication adherence/adverse effects. - Referral to Psychiatry for evaluation/management.  - Follow-up with primary provider as scheduled. - escitalopram (LEXAPRO) 5 MG tablet; Take 1 tablet (5 mg total) by mouth daily.  Dispense: 90 tablet; Refill: 0 - hydrOXYzine (VISTARIL) 25 MG capsule; Take 1 capsule (25 mg total) by mouth every 8 (eight) hours as needed.  Dispense: 30 capsule; Refill: 2 - Ambulatory referral to Psychiatry  5. Chronic pain syndrome - Continue Cyclobenzaprine , Gabapentin, and Ibuprofen  as prescribed. Counseled on medication adherence/adverse effects. - I discussed with patient in detail due to [facility] office policy I am unable to prescribe Oxycodone. Patient verbalized understanding. - Referral to Pain Clinic for  evaluation/management.  - Follow-up with primary provider as scheduled. - cyclobenzaprine  (FLEXERIL ) 10 MG tablet; Take 1 tablet (10 mg total) by mouth 2 (two) times daily as needed for muscle spasms.  Dispense: 90 tablet; Refill: 1 - gabapentin (NEURONTIN) 300 MG capsule; Take 1 capsule (300 mg total) by mouth daily.  Dispense: 90 capsule; Refill: 0 - ibuprofen  (ADVIL ) 800 MG tablet; Take 1 tablet (800 mg total) by mouth 3 (three) times daily as needed.  Dispense: 90 tablet; Refill: 2 - Ambulatory referral to Pain Clinic  6. Perennial allergic rhinitis - Continue Montelukast as prescribed. Counseled on medication adherence/adverse effects.  - Follow-up with primary provider in 3 months or sooner if needed. - montelukast (SINGULAIR) 10 MG tablet; Take 1 tablet (10 mg total) by mouth daily.  Dispense: 90 tablet; Refill: 0  7. Constipation, unspecified constipation type - Continue Polyethyene Glycol as prescribed. Counseled on medication adherence/adverse effects.  - Referral to Gastroenterology for evaluation/management. - polyethylene glycol (MIRALAX) 17 g packet; Take 17 g by mouth daily.  Dispense: 100 each; Refill: 0 -  Ambulatory referral to Gastroenterology  8. Colon cancer screening - Referral to Gastroenterology for colon cancer screening by colonoscopy. - Ambulatory referral to Gastroenterology    Patient was given clear instructions to go to Emergency Department or return to medical center if symptoms don't improve, worsen, or new problems develop.The patient verbalized understanding.  I discussed the assessment and treatment plan with the patient. The patient was provided an opportunity to ask questions and all were answered. The patient agreed with the plan and demonstrated an understanding of the instructions.   The patient was advised to call back or seek an in-person evaluation if the symptoms worsen or if the condition fails to improve as anticipated.    Lavona Pounds,  NP 12/24/2023, 10:32 AM Primary Care at [facility]

## 2023-12-25 ENCOUNTER — Ambulatory Visit: Payer: Self-pay | Admitting: Family

## 2023-12-25 DIAGNOSIS — N1832 Chronic kidney disease, stage 3b: Secondary | ICD-10-CM

## 2023-12-25 LAB — BASIC METABOLIC PANEL WITH GFR
BUN/Creatinine Ratio: 18 (ref 9–23)
BUN: 29 mg/dL — ABNORMAL HIGH (ref 6–24)
CO2: 17 mmol/L — ABNORMAL LOW (ref 20–29)
Calcium: 9.7 mg/dL (ref 8.7–10.2)
Chloride: 109 mmol/L — ABNORMAL HIGH (ref 96–106)
Creatinine, Ser: 1.64 mg/dL — ABNORMAL HIGH (ref 0.57–1.00)
Glucose: 89 mg/dL (ref 70–99)
Potassium: 4 mmol/L (ref 3.5–5.2)
Sodium: 142 mmol/L (ref 134–144)
eGFR: 38 mL/min/{1.73_m2} — ABNORMAL LOW (ref >59)

## 2023-12-25 LAB — HEMOGLOBIN A1C
Est. average glucose Bld gHb Est-mCnc: 134 mg/dL
Hgb A1c MFr Bld: 6.3 % — ABNORMAL HIGH (ref 4.8–5.6)

## 2024-01-09 ENCOUNTER — Telehealth: Payer: Self-pay | Admitting: Family

## 2024-01-09 ENCOUNTER — Ambulatory Visit: Payer: Self-pay

## 2024-01-09 NOTE — Telephone Encounter (Signed)
 FYI Only or Action Required?: Action required by provider: request for appointment.  Patient was last seen in primary care on 12/24/2023 by Lorren Greig PARAS, NP. Called Nurse Triage reporting Urinary Tract Infection. Symptoms began several weeks ago. Interventions attempted: Nothing. Symptoms are: gradually worsening.  Triage Disposition: See Physician Within 24 Hours  Patient/caregiver understands and will follow disposition?: Yes, will follow disposition  Reason for Disposition  Bad or foul-smelling urine  Answer Assessment - Initial Assessment Questions 1. SYMPTOM: What's the main symptom you're concerned about? (e.g., frequency, incontinence)     Odorous urine, worse in morning,  2. ONSET: When did the  odorous urine  start?     weeks 3. PAIN: Is there any pain? If Yes, ask: How bad is it? (Scale: 1-10; mild, moderate, severe)     denies 4. CAUSE: What do you think is causing the symptoms?     unsure 5. OTHER SYMPTOMS: Do you have any other symptoms? (e.g., blood in urine, fever, flank pain, pain with urination)     denies  Protocols used: Urinary Symptoms-A-AH

## 2024-01-09 NOTE — Telephone Encounter (Signed)
 I have attempted without success to contact this patient by phone to return their call. Was not able to LVM on answering machine

## 2024-01-09 NOTE — Telephone Encounter (Signed)
 Patient called back and was given our mobile bus information to be seen at her convenience.

## 2024-01-09 NOTE — Telephone Encounter (Signed)
 Copied from CRM [id number]. Topic: General - Other >> Jan 09, 2024 10:57 AM Wess RAMAN wrote: Reason for CRM: Patient would like Lorren, Amy,NP to write her a note to be out of work for awhile due to her medications not mixing with her having to work in the sun/heat.   She also wanted to let her know the liver specialist has not yet reached out.  Callback [phone]

## 2024-01-14 ENCOUNTER — Telehealth: Payer: Self-pay | Admitting: Family

## 2024-01-14 ENCOUNTER — Ambulatory Visit: Admitting: Physician Assistant

## 2024-01-14 VITALS — BP 134/94 | HR 102 | Ht 63.0 in | Wt 168.4 lb

## 2024-01-14 DIAGNOSIS — N3000 Acute cystitis without hematuria: Secondary | ICD-10-CM | POA: Diagnosis not present

## 2024-01-14 DIAGNOSIS — L299 Pruritus, unspecified: Secondary | ICD-10-CM

## 2024-01-14 LAB — POCT URINALYSIS DIP (CLINITEK)
Glucose, UA: 100 mg/dL — AB
Leukocytes, UA: NEGATIVE
Nitrite, UA: POSITIVE — AB
POC PROTEIN,UA: 100 — AB
Spec Grav, UA: >=1.03 — AB (ref 1.010–1.025)
Urobilinogen, UA: 1 U/dL
pH, UA: 6.5 (ref 5.0–8.0)

## 2024-01-14 MED ORDER — NITROFURANTOIN MONOHYD MACRO 100 MG PO CAPS: 100.0000 mg | ORAL_CAPSULE | Freq: Two times a day (BID) | ORAL | 0 refills | Status: AC

## 2024-01-14 MED ORDER — HYDROXYZINE HCL 25 MG PO TABS
ORAL_TABLET | ORAL | 2 refills | Status: AC
Start: 2024-01-14 — End: ?

## 2024-01-14 NOTE — Progress Notes (Unsigned)
   Established Patient Office Visit  Subjective   Patient ID: Sheila Pena, female    DOB: [birth year]  Age: 53 y.o. MRN: [id number]  No chief complaint on file.   HPI  {History (Optional):23778}  ROS    Objective:     BP (!) 134/94 (BP Location: Left Arm, Patient Position: Sitting, Cuff Size: Normal)   Pulse (!) 102   Ht 5' 3 (1.6 m)   Wt 168 lb 6.4 oz (76.4 kg)   BMI 29.83 kg/m  {Vitals History (Optional):23777}  Physical Exam   No results found for any visits on 01/14/24.  {Labs (Optional):23779}  The ASCVD Risk score (Arnett DK, et al., 2019) failed to calculate for the following reasons:   Cannot find a previous HDL lab   Cannot find a previous total cholesterol lab    Assessment & Plan:   Problem List Items Addressed This Visit   None   No follow-ups on file.    Kirk RAMAN Mayers, PA-C

## 2024-01-14 NOTE — Telephone Encounter (Signed)
 Copied from CRM [id number]. Topic: Clinical - Medication Question >> Jan 14, 2024  3:13 PM Precious C wrote: Reason for CRM: Patient called regarding a medication refill but was unsure which medications needed to be refilled. She stated she would call back with that information. She also inquired about the status of a work excuse note related to her medications not mixing well with working in the heat/sun. I informed her that a registered medical assistant attempted to contact her but was unsuccessful and unable to leave a voicemail.

## 2024-01-14 NOTE — Patient Instructions (Signed)
 VISIT SUMMARY:  Today, we addressed your concerns about generalized itching and foul-smelling urine.  YOUR PLAN:  -GENERALIZED ITCHING: Generalized itching without a visible rash can be challenging to diagnose. We will change your hydroxyzine  to tablet form to help manage the itching. Please take a full tablet at bedtime and a half tablet during the day. We are also referring you to a dermatologist for further evaluation.  -URINARY TRACT INFECTION (UTI): A urinary tract infection is an infection in any part of your urinary system. Your urinalysis showed signs of a UTI, so we have prescribed Macrobid  (nitrofurantoin ) to be taken twice daily for 5 days. We have also sent a urine sample for further testing.  Pruritus Pruritus is an itchy feeling on the skin. One of the most common causes is dry skin, but many different things can cause itching. Most cases of itching do not require medical attention. Sometimes itchy skin can turn into a rash or a secondary infection. Follow these instructions at home: Skin care  Do not use scented soaps, detergents, perfumes, and cosmetic products. Instead, use gentle, unscented versions of these items. Apply moisturizing creams to your skin frequently, at least twice daily. Apply immediately after bathing while skin is still wet. Take medicines or apply medicated creams only as told by your health care provider. This may include: Corticosteroid cream or topical calcineurin inhibitor. Anti-itch lotions containing urea, camphor, or menthol. Oral antihistamines. Do not take hot showers or baths, which can make itching worse. A short, cool shower may help with itching as long as you apply moisturizing lotion after the shower. Apply a cool, wet cloth (cool compress) to the affected areas. You may take lukewarm baths with one of the following: Epsom salts. You can get these at your local pharmacy or grocery store. Follow the instructions on the packaging. Baking  soda. Pour a small amount into the bath as told by your health care provider. Colloidal oatmeal. You can get this at your local pharmacy or grocery store. Follow the instructions on the packaging. Do not scratch your skin. General instructions Avoid wearing tight clothes. Keep a journal to help find out what is causing your itching. Write down: What you eat and drink. What cosmetic products you use. What soaps or detergents you use. What you wear, including jewelry. Use a humidifier. This keeps the air moist, which helps to prevent dry skin. Be aware of any changes in your itchiness. Tell your health care provider about any changes. Contact a health care provider if: The itching does not go away after several days. You notice redness, warmth, or drainage on the skin where you have scratched. You are unusually thirsty or urinating more than normal. Your skin tingles or feels numb. Your skin or the white parts of your eyes turn yellow (jaundice). You feel weak. You have any of the following: Night sweats. Tiredness (fatigue). Weight loss. Abdominal pain. Summary Pruritus is an itchy feeling on the skin. One of the most common causes is dry skin, but many different conditions and factors can cause itching. Apply moisturizing creams to your skin frequently, at least twice daily. Apply immediately after bathing while skin is still wet. Take medicines or apply medicated creams only as told by your health care provider. Do not take hot showers or baths. Do not use scented soaps, detergents, perfumes, or cosmetic products. Keep a journal to help find out what is causing your itching. This information is not intended to replace advice given to you by  your health care provider. Make sure you discuss any questions you have with your health care provider. Document Revised: 08/02/2021 Document Reviewed: 08/02/2021 Elsevier Patient Education  2024 [employer].

## 2024-01-15 ENCOUNTER — Other Ambulatory Visit: Payer: Self-pay | Admitting: Family

## 2024-01-15 ENCOUNTER — Encounter: Payer: Self-pay | Admitting: Physician Assistant

## 2024-01-15 DIAGNOSIS — I10 Essential (primary) hypertension: Secondary | ICD-10-CM

## 2024-01-15 DIAGNOSIS — E1165 Type 2 diabetes mellitus with hyperglycemia: Secondary | ICD-10-CM

## 2024-01-15 DIAGNOSIS — F32A Depression, unspecified: Secondary | ICD-10-CM

## 2024-01-15 DIAGNOSIS — J3089 Other allergic rhinitis: Secondary | ICD-10-CM

## 2024-01-15 DIAGNOSIS — G894 Chronic pain syndrome: Secondary | ICD-10-CM

## 2024-01-15 NOTE — Telephone Encounter (Signed)
 Provider reordered

## 2024-01-15 NOTE — Telephone Encounter (Signed)
 Copied from CRM [id number]. Topic: Clinical - Medication Refill >> Jan 15, 2024  9:47 AM Carlatta H wrote: Medication: cetirizine (ZYRTEC) 10 MG tablet gabapentin  (NEURONTIN ) 300 MG capsule cyclobenzaprine  (FLEXERIL ) 10 MG tablet montelukast  (SINGULAIR ) 10 MG tablet hydrochlorothiazide  (HYDRODIURIL ) 25 MG tablet lisinopril  (ZESTRIL ) 5 MG tablet metFORMIN  (GLUCOPHAGE ) 500 MG tablet escitalopram  (LEXAPRO ) 5 MG tablet ibuprofen  (ADVIL ) 800 MG tablet [[id number]]  Has the patient contacted their pharmacy? Yes (Agent: If no, request that the patient contact the pharmacy for the refill. If patient does not wish to contact the pharmacy document the reason why and proceed with request.) (Agent: If yes, when and what did the pharmacy advise?) Patient waited to long to pick up medications  This is the patient's preferred pharmacy:  [id number] [place], [place] - [address] AT [facility] [address]  [place] [address] Phone: [phone] Fax: [phone]  Is this the correct pharmacy for this prescription? Yes If no, delete pharmacy and type the correct one.   Has the prescription been filled recently? No  Is the patient out of the medication? Yes  Has the patient been seen for an appointment in the last year OR does the patient have an upcoming appointment? Yes  Can we respond through MyChart? No  Agent: Please be advised that Rx refills may take up to 3 business days. We ask that you follow-up with your pharmacy.

## 2024-01-17 MED ORDER — CETIRIZINE HCL 10 MG PO TABS: 10.0000 mg | ORAL_TABLET | Freq: Every day | ORAL | 0 refills | Status: DC

## 2024-01-17 NOTE — Telephone Encounter (Signed)
 Requested medications are due for refill today.  Cyclobenzaprine  is not due for refill. Zyrtec  is historical  Requested medications are on the active medications list.  yes  Last refill. Cyclobenzaprine  refilled 6/117/2025 #90 1 rf,  Zyrtec  refilled 12/01/2023  Future visit scheduled.   yes  Notes to clinic.  Refill/ refusal not delegated for cyclobenzaprine . Zyrtec  is historical .    Requested Prescriptions  Pending Prescriptions Disp Refills   cetirizine  (ZYRTEC ) 10 MG tablet      Sig: Take 1 tablet (10 mg total) by mouth daily.     Ear, Nose, and Throat:  Antihistamines 2 Failed - 01/17/2024 10:49 AM      Failed - Cr in normal range and within 360 days    Creatinine, Ser  Date Value Ref Range Status  12/24/2023 1.64 (H) 0.57 - 1.00 mg/dL Final         Passed - Valid encounter within last 12 months    Recent Outpatient Visits           3 weeks ago Encounter to establish care   [facility], Amy J, NP               cyclobenzaprine  (FLEXERIL ) 10 MG tablet 90 tablet 1    Sig: Take 1 tablet (10 mg total) by mouth 2 (two) times daily as needed for muscle spasms.     Not Delegated - Analgesics:  Muscle Relaxants Failed - 01/17/2024 10:49 AM      Failed - This refill cannot be delegated      Passed - Valid encounter within last 6 months    Recent Outpatient Visits           3 weeks ago Encounter to establish care   [facility], Amy J, NP              Refused Prescriptions Disp Refills   gabapentin  (NEURONTIN ) 300 MG capsule 90 capsule 0    Sig: Take 1 capsule (300 mg total) by mouth daily.     Neurology: Anticonvulsants - gabapentin  Failed - 01/17/2024 10:49 AM      Failed - Cr in normal range and within 360 days    Creatinine, Ser  Date Value Ref Range Status  12/24/2023 1.64 (H) 0.57 - 1.00 mg/dL Final         Passed - Completed PHQ-2 or PHQ-9 in the last 360 days       Passed - Valid encounter within last 12 months    Recent Outpatient Visits           3 weeks ago Encounter to establish care   [place] [facility], Amy J, NP               montelukast  (SINGULAIR ) 10 MG tablet 90 tablet 0    Sig: Take 1 tablet (10 mg total) by mouth daily.     Pulmonology:  Leukotriene Inhibitors Passed - 01/17/2024 10:49 AM      Passed - Valid encounter within last 12 months    Recent Outpatient Visits           3 weeks ago Encounter to establish care   [facility], Amy J, NP               hydrochlorothiazide  (HYDRODIURIL ) 25 MG tablet 90 tablet 0    Sig: Take 1  tablet (25 mg total) by mouth daily.     Cardiovascular: Diuretics - Thiazide Failed - 01/17/2024 10:49 AM      Failed - Cr in normal range and within 180 days    Creatinine, Ser  Date Value Ref Range Status  12/24/2023 1.64 (H) 0.57 - 1.00 mg/dL Final         Failed - Last BP in normal range    BP Readings from Last 1 Encounters:  01/14/24 (!) 134/94         Passed - K in normal range and within 180 days    Potassium  Date Value Ref Range Status  12/24/2023 4.0 3.5 - 5.2 mmol/L Final         Passed - Na in normal range and within 180 days    Sodium  Date Value Ref Range Status  12/24/2023 142 134 - 144 mmol/L Final         Passed - Valid encounter within last 6 months    Recent Outpatient Visits           3 weeks ago Encounter to establish care   [place] [facility], Amy J, NP               lisinopril  (ZESTRIL ) 5 MG tablet 90 tablet 0    Sig: Take 1 tablet (5 mg total) by mouth daily.     Cardiovascular:  ACE Inhibitors Failed - 01/17/2024 10:49 AM      Failed - Cr in normal range and within 180 days    Creatinine, Ser  Date Value Ref Range Status  12/24/2023 1.64 (H) 0.57 - 1.00 mg/dL Final         Failed - Last BP in normal range    BP Readings from Last 1  Encounters:  01/14/24 (!) 134/94         Passed - K in normal range and within 180 days    Potassium  Date Value Ref Range Status  12/24/2023 4.0 3.5 - 5.2 mmol/L Final         Passed - Patient is not pregnant      Passed - Valid encounter within last 6 months    Recent Outpatient Visits           3 weeks ago Encounter to establish care   [place] [facility], Amy J, NP               metFORMIN  (GLUCOPHAGE ) 500 MG tablet 90 tablet 0    Sig: Take 1 tablet (500 mg total) by mouth daily.     Endocrinology:  Diabetes - Biguanides Failed - 01/17/2024 10:49 AM      Failed - Cr in normal range and within 360 days    Creatinine, Ser  Date Value Ref Range Status  12/24/2023 1.64 (H) 0.57 - 1.00 mg/dL Final         Failed - eGFR in normal range and within 360 days    eGFR  Date Value Ref Range Status  12/24/2023 38 (L) >59 mL/min/1.73 Final         Failed - B12 Level in normal range and within 720 days    No results found for: VITAMINB12       Failed - CBC within normal limits and completed in the last 12 months    No results found for: WBC, WBCKUC No results found for: RBC, RBCKUC No results  found for: HGB, HGBKUC, HGBPOCKUC, HGBOTHER, TOTHGB, HGBPLASMA, LABHEMOF No results found for: HCT, HCTKUC, SRHCT No results found for: MCHC, MCHCKUC No results found for: MCH, MCHKUC No results found for: MCVKUC, MCV No results found for: PLTCOUNTKUC, LABPLAT, POCPLA No results found for: RDW, RDWKUC, POCRDW       Passed - HBA1C is between 0 and 7.9 and within 180 days    Hgb A1c MFr Bld  Date Value Ref Range Status  12/24/2023 6.3 (H) 4.8 - 5.6 % Final    Comment:             Prediabetes: 5.7 - 6.4          Diabetes: >6.4          Glycemic control for adults with diabetes: <7.0          Passed - Valid encounter within last 6 months    Recent Outpatient Visits           3 weeks ago  Encounter to establish care   [place] [facility], Amy J, NP               escitalopram  (LEXAPRO ) 5 MG tablet 90 tablet 0    Sig: Take 1 tablet (5 mg total) by mouth daily.     Psychiatry:  Antidepressants - SSRI Passed - 01/17/2024 10:49 AM      Passed - Valid encounter within last 6 months    Recent Outpatient Visits           3 weeks ago Encounter to establish care   [facility], Amy J, NP               ibuprofen  (ADVIL ) 800 MG tablet 90 tablet 2    Sig: Take 1 tablet (800 mg total) by mouth 3 (three) times daily as needed.     Analgesics:  NSAIDS Failed - 01/17/2024 10:49 AM      Failed - Manual Review: Labs are only required if the patient has taken medication for more than 8 weeks.      Failed - Cr in normal range and within 360 days    Creatinine, Ser  Date Value Ref Range Status  12/24/2023 1.64 (H) 0.57 - 1.00 mg/dL Final         Failed - HGB in normal range and within 360 days    No results found for: HGB, HGBKUC, HGBPOCKUC, HGBOTHER, TOTHGB, HGBPLASMA       Failed - PLT in normal range and within 360 days    No results found for: PLT, PLTCOUNTKUC, LABPLAT, POCPLA       Failed - HCT in normal range and within 360 days    No results found for: HCT, HCTKUC, SRHCT       Passed - eGFR is 30 or above and within 360 days    eGFR  Date Value Ref Range Status  12/24/2023 38 (L) >59 mL/min/1.73 Final         Passed - Patient is not pregnant      Passed - Valid encounter within last 12 months    Recent Outpatient Visits           3 weeks ago Encounter to establish care   [facility], Greig PARAS, NP

## 2024-01-17 NOTE — Telephone Encounter (Signed)
 Requested Prescriptions  Pending Prescriptions Disp Refills   cetirizine  (ZYRTEC ) 10 MG tablet      Sig: Take 1 tablet (10 mg total) by mouth daily.     Ear, Nose, and Throat:  Antihistamines 2 Failed - 01/17/2024 10:48 AM      Failed - Cr in normal range and within 360 days    Creatinine, Ser  Date Value Ref Range Status  12/24/2023 1.64 (H) 0.57 - 1.00 mg/dL Final         Passed - Valid encounter within last 12 months    Recent Outpatient Visits           3 weeks ago Encounter to establish care   [place] [facility], Amy J, NP               gabapentin  (NEURONTIN ) 300 MG capsule 90 capsule 0    Sig: Take 1 capsule (300 mg total) by mouth daily.     Neurology: Anticonvulsants - gabapentin  Failed - 01/17/2024 10:48 AM      Failed - Cr in normal range and within 360 days    Creatinine, Ser  Date Value Ref Range Status  12/24/2023 1.64 (H) 0.57 - 1.00 mg/dL Final         Passed - Completed PHQ-2 or PHQ-9 in the last 360 days      Passed - Valid encounter within last 12 months    Recent Outpatient Visits           3 weeks ago Encounter to establish care   [place] [facility], Amy J, NP               cyclobenzaprine  (FLEXERIL ) 10 MG tablet 90 tablet 1    Sig: Take 1 tablet (10 mg total) by mouth 2 (two) times daily as needed for muscle spasms.     Not Delegated - Analgesics:  Muscle Relaxants Failed - 01/17/2024 10:48 AM      Failed - This refill cannot be delegated      Passed - Valid encounter within last 6 months    Recent Outpatient Visits           3 weeks ago Encounter to establish care   [place] [facility], Amy J, NP               montelukast  (SINGULAIR ) 10 MG tablet 90 tablet 0    Sig: Take 1 tablet (10 mg total) by mouth daily.     Pulmonology:  Leukotriene Inhibitors Passed - 01/17/2024 10:48 AM      Passed - Valid encounter within last 12  months    Recent Outpatient Visits           3 weeks ago Encounter to establish care   [place] [facility], Amy J, NP               hydrochlorothiazide  (HYDRODIURIL ) 25 MG tablet 90 tablet 0    Sig: Take 1 tablet (25 mg total) by mouth daily.     Cardiovascular: Diuretics - Thiazide Failed - 01/17/2024 10:48 AM      Failed - Cr in normal range and within 180 days    Creatinine, Ser  Date Value Ref Range Status  12/24/2023 1.64 (H) 0.57 - 1.00 mg/dL Final         Failed - Last BP in normal range  BP Readings from Last 1 Encounters:  01/14/24 (!) 134/94         Passed - K in normal range and within 180 days    Potassium  Date Value Ref Range Status  12/24/2023 4.0 3.5 - 5.2 mmol/L Final         Passed - Na in normal range and within 180 days    Sodium  Date Value Ref Range Status  12/24/2023 142 134 - 144 mmol/L Final         Passed - Valid encounter within last 6 months    Recent Outpatient Visits           3 weeks ago Encounter to establish care   [place] [facility], Amy J, NP               lisinopril  (ZESTRIL ) 5 MG tablet 90 tablet 0    Sig: Take 1 tablet (5 mg total) by mouth daily.     Cardiovascular:  ACE Inhibitors Failed - 01/17/2024 10:48 AM      Failed - Cr in normal range and within 180 days    Creatinine, Ser  Date Value Ref Range Status  12/24/2023 1.64 (H) 0.57 - 1.00 mg/dL Final         Failed - Last BP in normal range    BP Readings from Last 1 Encounters:  01/14/24 (!) 134/94         Passed - K in normal range and within 180 days    Potassium  Date Value Ref Range Status  12/24/2023 4.0 3.5 - 5.2 mmol/L Final         Passed - Patient is not pregnant      Passed - Valid encounter within last 6 months    Recent Outpatient Visits           3 weeks ago Encounter to establish care   [place] [facility], Amy J, NP                metFORMIN  (GLUCOPHAGE ) 500 MG tablet 90 tablet 0    Sig: Take 1 tablet (500 mg total) by mouth daily.     Endocrinology:  Diabetes - Biguanides Failed - 01/17/2024 10:48 AM      Failed - Cr in normal range and within 360 days    Creatinine, Ser  Date Value Ref Range Status  12/24/2023 1.64 (H) 0.57 - 1.00 mg/dL Final         Failed - eGFR in normal range and within 360 days    eGFR  Date Value Ref Range Status  12/24/2023 38 (L) >59 mL/min/1.73 Final         Failed - B12 Level in normal range and within 720 days    No results found for: VITAMINB12       Failed - CBC within normal limits and completed in the last 12 months    No results found for: WBC, WBCKUC No results found for: RBC, RBCKUC No results found for: HGB, HGBKUC, HGBPOCKUC, HGBOTHER, TOTHGB, HGBPLASMA, LABHEMOF No results found for: HCT, HCTKUC, SRHCT No results found for: MCHC, MCHCKUC No results found for: MCH, MCHKUC No results found for: MCVKUC, MCV No results found for: PLTCOUNTKUC, LABPLAT, POCPLA No results found for: RDW, RDWKUC, POCRDW       Passed - HBA1C is between 0 and 7.9 and within 180 days    Hgb A1c MFr Bld  Date Value Ref Range Status  12/24/2023 6.3 (H) 4.8 - 5.6 % Final    Comment:             Prediabetes: 5.7 - 6.4          Diabetes: >6.4          Glycemic control for adults with diabetes: <7.0          Passed - Valid encounter within last 6 months    Recent Outpatient Visits           3 weeks ago Encounter to establish care   [place] [facility], Amy J, NP               escitalopram  (LEXAPRO ) 5 MG tablet 90 tablet 0    Sig: Take 1 tablet (5 mg total) by mouth daily.     Psychiatry:  Antidepressants - SSRI Passed - 01/17/2024 10:48 AM      Passed - Valid encounter within last 6 months    Recent Outpatient Visits           3 weeks ago Encounter to establish care   [facility], Amy J, NP               ibuprofen  (ADVIL ) 800 MG tablet 90 tablet 2    Sig: Take 1 tablet (800 mg total) by mouth 3 (three) times daily as needed.     Analgesics:  NSAIDS Failed - 01/17/2024 10:48 AM      Failed - Manual Review: Labs are only required if the patient has taken medication for more than 8 weeks.      Failed - Cr in normal range and within 360 days    Creatinine, Ser  Date Value Ref Range Status  12/24/2023 1.64 (H) 0.57 - 1.00 mg/dL Final         Failed - HGB in normal range and within 360 days    No results found for: HGB, HGBKUC, HGBPOCKUC, HGBOTHER, TOTHGB, HGBPLASMA       Failed - PLT in normal range and within 360 days    No results found for: PLT, PLTCOUNTKUC, LABPLAT, POCPLA       Failed - HCT in normal range and within 360 days    No results found for: HCT, HCTKUC, SRHCT       Passed - eGFR is 30 or above and within 360 days    eGFR  Date Value Ref Range Status  12/24/2023 38 (L) >59 mL/min/1.73 Final         Passed - Patient is not pregnant      Passed - Valid encounter within last 12 months    Recent Outpatient Visits           3 weeks ago Encounter to establish care   [facility], Greig PARAS, NP

## 2024-01-17 NOTE — Telephone Encounter (Signed)
 Complete

## 2024-01-21 ENCOUNTER — Telehealth: Payer: Self-pay | Admitting: Family

## 2024-01-21 NOTE — Telephone Encounter (Signed)
 Report to Emergency Department/Urgent Care/call 911 for immediate evaluation. Follow-up with Primary Care.

## 2024-01-21 NOTE — Telephone Encounter (Signed)
 I called patient with PCP recommendations and she stated that she is not in [place] at this time and will call back and make an appointment

## 2024-01-21 NOTE — Telephone Encounter (Signed)
 Copied from CRM [id number]. Topic: General - Other >> Jan 15, 2024  9:53 AM Carlatta H wrote: Reason for CRM: Patient is requesting a work excuse due to working outside and medication side effects//Please call patient to advise >> Jan 21, 2024  9:49 AM Zebedee SAUNDERS wrote: Patient calling again requesting a work excuse due to working outside and medication side effects. Pt would like a call back at [phone].

## 2024-03-05 ENCOUNTER — Telehealth: Payer: Self-pay

## 2024-03-05 NOTE — Telephone Encounter (Signed)
 Copied from CRM #[id number]. Topic: Clinical - Medication Question >> Mar 05, 2024 11:33 AM Rea ORN wrote: Reason for CRM: Pt called to request Tussionex to sent to [facility] because she has a head cold for the past three days. Pt stated OTC cough medicine is not working. Pt symptoms are congestion, runny nose and cough.

## 2024-03-05 NOTE — Telephone Encounter (Signed)
 Tussionex contains Hydrocodone which I cannot prescribe due to [facility] prescribing policy. Recommendation for patient to report to Emergency Department/Urgent Care/call 911 for immediate medical evaluation.

## 2024-03-12 ENCOUNTER — Encounter: Payer: Self-pay | Admitting: Family

## 2024-03-13 ENCOUNTER — Ambulatory Visit
Admission: EM | Admit: 2024-03-13 | Discharge: 2024-03-13 | Disposition: A | Discharge status: Home / Self Care | Attending: Family Medicine | Admitting: Family Medicine

## 2024-03-13 ENCOUNTER — Encounter: Payer: Self-pay | Admitting: Emergency Medicine

## 2024-03-13 DIAGNOSIS — J019 Acute sinusitis, unspecified: Secondary | ICD-10-CM

## 2024-03-13 DIAGNOSIS — J4521 Mild intermittent asthma with (acute) exacerbation: Secondary | ICD-10-CM

## 2024-03-13 MED ORDER — BENZONATATE 100 MG PO CAPS: 100.0000 mg | ORAL_CAPSULE | Freq: Three times a day (TID) | ORAL | 0 refills | Status: AC | PRN

## 2024-03-13 MED ORDER — CEFDINIR 300 MG PO CAPS: 600.0000 mg | ORAL_CAPSULE | Freq: Every day | ORAL | 0 refills | Status: AC

## 2024-03-13 MED ORDER — ALBUTEROL SULFATE HFA 108 (90 BASE) MCG/ACT IN AERS: 2.0000 | INHALATION_SPRAY | RESPIRATORY_TRACT | 0 refills | Status: AC | PRN

## 2024-03-13 MED ORDER — PREDNISONE 20 MG PO TABS
40.0000 mg | ORAL_TABLET | Freq: Every day | ORAL | 0 refills | Status: AC
Start: 2024-03-13 — End: 2024-03-18

## 2024-03-13 NOTE — ED Provider Notes (Addendum)
 [facility]    CSN: [id number] Arrival date & time: 03/13/24  1554      History   Chief Complaint Chief Complaint  Patient presents with   Headache   Cough   Nasal Congestion    HPI Sheila Pena is a 53 y.o. female.    Headache Associated symptoms: cough   Cough Associated symptoms: headaches   Here for 2-week history of headache, nasal congestion and cough.  She has also been wheezing off-and-on.  She maybe had some subjective fever off and on also.  No nausea vomiting or diarrhea.  She is having some sinus pressure that worsens with bending over now.  She is allergic to Tylenol only.  Has been on a while since she had a period and she is perimenopausal.  She has prediabetes and history of asthma  Past Medical History:  Diagnosis Date   Asthma    Bronchitis    Hypertension     There are no active problems to display for this patient.   History reviewed. No pertinent surgical history.  OB History   No obstetric history on file.      Home Medications    Prior to Admission medications   Medication Sig Start Date End Date Taking? Authorizing Provider  albuterol  (VENTOLIN  HFA) 108 (90 Base) MCG/ACT inhaler Inhale 2 puffs into the lungs every 4 (four) hours as needed for wheezing or shortness of breath. 03/13/24  Yes Vonna Sharlet POUR, MD  benzonatate  (TESSALON ) 100 MG capsule Take 1 capsule (100 mg total) by mouth 3 (three) times daily as needed for cough. 03/13/24  Yes Vonna Sharlet POUR, MD  cefdinir  (OMNICEF ) 300 MG capsule Take 2 capsules (600 mg total) by mouth daily for 7 days. 03/13/24 03/20/24 Yes Vonna Sharlet POUR, MD  predniSONE  (DELTASONE ) 20 MG tablet Take 2 tablets (40 mg total) by mouth daily with breakfast for 5 days. 03/13/24 03/18/24 Yes Lynda Wanninger, Sharlet POUR, MD  cetirizine  (ZYRTEC ) 10 MG tablet Take 1 tablet (10 mg total) by mouth daily. 01/17/24   Lorren Greig PARAS, NP  cyclobenzaprine  (FLEXERIL ) 10 MG tablet Take 1 tablet (10 mg total) by  mouth 2 (two) times daily as needed for muscle spasms. 12/24/23   Lorren Greig PARAS, NP  escitalopram  (LEXAPRO ) 5 MG tablet Take 1 tablet (5 mg total) by mouth daily. 12/24/23   Lorren Greig PARAS, NP  gabapentin  (NEURONTIN ) 300 MG capsule Take 1 capsule (300 mg total) by mouth daily. 12/24/23   Lorren Greig PARAS, NP  hydrochlorothiazide  (HYDRODIURIL ) 25 MG tablet Take 1 tablet (25 mg total) by mouth daily. 12/24/23   Lorren Greig PARAS, NP  hydrOXYzine  (ATARAX ) 25 MG tablet Take 1/2 - 1 full tab PO TID PRN for itching and anxiety 01/14/24   Mayers, Cari S, PA-C  lisinopril  (ZESTRIL ) 5 MG tablet Take 1 tablet (5 mg total) by mouth daily. 12/24/23   Lorren Greig PARAS, NP  metFORMIN  (GLUCOPHAGE ) 500 MG tablet Take 1 tablet (500 mg total) by mouth daily. 12/24/23   Lorren Greig PARAS, NP  montelukast  (SINGULAIR ) 10 MG tablet Take 1 tablet (10 mg total) by mouth daily. 12/24/23   Lorren Greig PARAS, NP  polyethylene glycol (MIRALAX ) 17 g packet Take 17 g by mouth daily. 12/24/23   Lorren Greig PARAS, NP    Family History History reviewed. No pertinent family history.  Social History Social History   Tobacco Use   Smoking status: Never    Passive exposure: Never   Smokeless tobacco:  Never  Vaping Use   Vaping status: Never Used  Substance Use Topics   Alcohol use: No   Drug use: No     Allergies   Tylenol [acetaminophen], Chocolate, and Pineapple   Review of Systems Review of Systems  Respiratory:  Positive for cough.   Neurological:  Positive for headaches.     Physical Exam Triage Vital Signs ED Triage Vitals  Encounter Vitals Group     BP 03/13/24 1623 93/66     Girls Systolic BP Percentile --      Girls Diastolic BP Percentile --      Boys Systolic BP Percentile --      Boys Diastolic BP Percentile --      Pulse Rate 03/13/24 1623 93     Resp 03/13/24 1623 16     Temp 03/13/24 1623 98.6 F (37 C)     Temp Source 03/13/24 1623 Oral     SpO2 03/13/24 1623 97 %     Weight 03/13/24 1621 168 lb 6.9  oz (76.4 kg)     Height --      Head Circumference --      Peak Flow --      Pain Score 03/13/24 1619 10     Pain Loc --      Pain Education --      Exclude from Growth Chart --    No data found.  Updated Vital Signs BP 93/66 (BP Location: Right Arm)   Pulse 93   Temp 98.6 F (37 C) (Oral)   Resp 16   Wt 76.4 kg   LMP  (LMP Unknown)   SpO2 97%   BMI 29.84 kg/m   Visual Acuity Right Eye Distance:   Left Eye Distance:   Bilateral Distance:    Right Eye Near:   Left Eye Near:    Bilateral Near:     Physical Exam Vitals reviewed.  Constitutional:      General: She is not in acute distress.    Appearance: She is not ill-appearing, toxic-appearing or diaphoretic.  HENT:     Right Ear: Tympanic membrane and ear canal normal.     Left Ear: Tympanic membrane and ear canal normal.     Nose: Congestion present.     Mouth/Throat:     Mouth: Mucous membranes are moist.     Comments: There is some white exudate draining in the posterior oropharynx Eyes:     Extraocular Movements: Extraocular movements intact.     Conjunctiva/sclera: Conjunctivae normal.     Pupils: Pupils are equal, round, and reactive to light.  Cardiovascular:     Rate and Rhythm: Normal rate and regular rhythm.     Heart sounds: No murmur heard. Pulmonary:     Effort: No respiratory distress.     Breath sounds: No stridor. No wheezing, rhonchi or rales.  Musculoskeletal:     Cervical back: Neck supple.  Lymphadenopathy:     Cervical: No cervical adenopathy.  Skin:    Capillary Refill: Capillary refill takes less than 2 seconds.     Coloration: Skin is not jaundiced or pale.  Neurological:     General: No focal deficit present.     Mental Status: She is alert and oriented to person, place, and time.  Psychiatric:        Behavior: Behavior normal.      UC Treatments / Results  Labs (all labs ordered are listed, but only abnormal results are displayed) Labs Reviewed -  No data to  display  EKG   Radiology No results found.  Procedures Procedures (including critical care time)  Medications Ordered in UC Medications - No data to display  Initial Impression / Assessment and Plan / UC Course  I have reviewed the triage vital signs and the nursing notes.  Pertinent labs & imaging results that were available during my care of the patient were reviewed by me and considered in my medical decision making (see chart for details).      Final Clinical Impressions(s) / UC Diagnoses   Final diagnoses:  Mild intermittent asthma with (acute) exacerbation  Acute sinusitis, recurrence not specified, unspecified location     Discharge Instructions      Albuterol  inhaler--do 2 puffs every 4 hours as needed for shortness of breath or wheezing  Take prednisone  20 mg--2 daily for 5 days  Take cefdinir  300 mg--2 capsules together daily for 7 days; this is a antibiotic for your sinus infection.  Take benzonatate  100 mg, 1 tab every 8 hours as needed for cough.  Take prednisone  20 mg--2 daily for 5 days; this is for inflammation in your lungs      ED Prescriptions     Medication Sig Dispense Auth. Provider   albuterol  (VENTOLIN  HFA) 108 (90 Base) MCG/ACT inhaler Inhale 2 puffs into the lungs every 4 (four) hours as needed for wheezing or shortness of breath. 1 each Vonna Sharlet POUR, MD   cefdinir  (OMNICEF ) 300 MG capsule Take 2 capsules (600 mg total) by mouth daily for 7 days. 14 capsule Jermarion Poffenberger K, MD   predniSONE  (DELTASONE ) 20 MG tablet Take 2 tablets (40 mg total) by mouth daily with breakfast for 5 days. 10 tablet Vonna Sharlet POUR, MD   benzonatate  (TESSALON ) 100 MG capsule Take 1 capsule (100 mg total) by mouth 3 (three) times daily as needed for cough. 21 capsule Naveya Ellerman K, MD      PDMP not reviewed this encounter.   Vonna Sharlet POUR, MD 03/13/24 1653    Vonna Sharlet POUR, MD 03/13/24 [phone]

## 2024-03-13 NOTE — ED Triage Notes (Signed)
 Pt presents c/o headache, cough, eye pressure and nasal congestion x 14 days. Pt reports she has not tried anything OTC for sxs. Pt denies emesis and diarrhea.

## 2024-03-13 NOTE — Discharge Instructions (Signed)
 Albuterol  inhaler--do 2 puffs every 4 hours as needed for shortness of breath or wheezing  Take prednisone  20 mg--2 daily for 5 days  Take cefdinir  300 mg--2 capsules together daily for 7 days; this is a antibiotic for your sinus infection.  Take benzonatate  100 mg, 1 tab every 8 hours as needed for cough.  Take prednisone  20 mg--2 daily for 5 days; this is for inflammation in your lungs

## 2024-03-20 NOTE — Telephone Encounter (Signed)
 I called patient with PCP recommendations however n one answered and I was unable to leave an voicemail

## 2024-04-06 ENCOUNTER — Encounter: Admitting: Family

## 2024-04-06 NOTE — Progress Notes (Signed)
 Erroneous encounter-disregard

## 2024-04-16 ENCOUNTER — Other Ambulatory Visit: Payer: Self-pay | Admitting: Family

## 2024-04-16 DIAGNOSIS — E1165 Type 2 diabetes mellitus with hyperglycemia: Secondary | ICD-10-CM

## 2024-04-16 DIAGNOSIS — I10 Essential (primary) hypertension: Secondary | ICD-10-CM

## 2024-04-16 DIAGNOSIS — F419 Anxiety disorder, unspecified: Secondary | ICD-10-CM

## 2024-04-16 DIAGNOSIS — G894 Chronic pain syndrome: Secondary | ICD-10-CM

## 2024-08-17 ENCOUNTER — Encounter: Admitting: Family

## 2024-08-21 ENCOUNTER — Encounter: Admitting: Family
